# Patient Record
Sex: Male | Born: 2000 | Race: White | Hispanic: No | Marital: Single | Smoking: Never smoker
Health system: Southern US, Community
[De-identification: ages and names within clinical notes are randomized; demographics above are authoritative.]

---
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
## 2019-04-01 ENCOUNTER — Other Ambulatory Visit: Payer: Self-pay

## 2019-04-01 ENCOUNTER — Other Ambulatory Visit: Payer: Self-pay | Admitting: Internal Medicine

## 2019-04-01 DIAGNOSIS — Z20822 Contact with and (suspected) exposure to covid-19: Secondary | ICD-10-CM

## 2019-04-05 ENCOUNTER — Telehealth: Payer: Self-pay

## 2019-04-05 LAB — NOVEL CORONAVIRUS, NAA: SARS-CoV-2, NAA: DETECTED — AB

## 2019-04-05 NOTE — Telephone Encounter (Signed)
Called [facility] and gave pt information and results to [place].
